# Patient Record
Sex: Female | Born: 2000
Health system: Southern US, Community
[De-identification: ages and names within clinical notes are randomized; demographics above are authoritative.]

## PROBLEM LIST (undated history)

## (undated) DIAGNOSIS — Z789 Other specified health status: Secondary | ICD-10-CM

---
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
## 2020-01-12 ENCOUNTER — Other Ambulatory Visit: Payer: Self-pay

## 2020-01-12 DIAGNOSIS — Z20822 Contact with and (suspected) exposure to covid-19: Secondary | ICD-10-CM

## 2020-01-14 LAB — NOVEL CORONAVIRUS, NAA: SARS-CoV-2, NAA: DETECTED — AB

## 2020-01-16 ENCOUNTER — Telehealth: Payer: Self-pay | Admitting: Nurse Practitioner

## 2020-01-16 NOTE — Telephone Encounter (Signed)
Called to discuss with Damya Pflum about Covid symptoms and the use of casirivimab/imdevimab, a combination monoclonal antibody infusion for those with mild to moderate Covid symptoms and at a high risk of hospitalization.    Pt does not qualify for infusion therapy as she has asymptomatic infection. Isolation precautions discussed. Advised to contact back for consideration should she develop symptoms. Patient verbalized understanding.    Willette Alma, AGPCNP-BC

## 2020-11-20 ENCOUNTER — Ambulatory Visit
Admission: RE | Admit: 2020-11-20 | Discharge: 2020-11-20 | Disposition: A | Discharge status: Home / Self Care | Payer: BC Managed Care – PPO | Source: Ambulatory Visit | Attending: Family Medicine | Admitting: Family Medicine

## 2020-11-20 ENCOUNTER — Other Ambulatory Visit: Payer: Self-pay

## 2020-11-20 ENCOUNTER — Ambulatory Visit: Payer: Self-pay

## 2020-11-20 VITALS — BP 111/75 | HR 79 | Temp 98.5°F | Ht 63.5 in | Wt 260.0 lb

## 2020-11-20 DIAGNOSIS — U071 COVID-19: Secondary | ICD-10-CM | POA: Diagnosis not present

## 2020-11-20 MED ORDER — PROMETHAZINE-DM 6.25-15 MG/5ML PO SYRP: 5.0000 mL | ORAL_SOLUTION | Freq: Four times a day (QID) | ORAL | 0 refills | Status: DC | PRN

## 2020-11-20 MED ORDER — CETIRIZINE HCL 10 MG PO TABS: 10.0000 mg | ORAL_TABLET | Freq: Every day | ORAL | 11 refills | Status: DC

## 2020-11-20 NOTE — ED Triage Notes (Signed)
Pt states that she has a headache, muscle aches, coughing, and sneezing. X2 days. Pt states that she tested positive 11/18/2020.

## 2020-11-20 NOTE — ED Provider Notes (Signed)
[facility]    CSN: [id number] Arrival date & time: 11/20/20  1548      History   Chief Complaint Chief Complaint  Patient presents with   Cough    Pt states that she has a cough, headache, sneezing and body aches. X2 days. Pt states that she tested positive 11/18/20    HPI Kendra Gibson is a 20 y.o. female.   HPI Patient presents with URI symptoms including cough, chills, body aches,  otalgia, nasal congestion, runny nose, and sinus pressure. Afebrile. Exposed to COVID exposure. Tested positive 11/18/20.Denies worrisome symptoms of shortness of breath, weakness, N&V, or chest pain.  History reviewed. No pertinent past medical history.  There are no problems to display for this patient.   Past Surgical History:  Procedure Laterality Date   EYE SURGERY      OB History   No obstetric history on file.      Home Medications    Prior to Admission medications   Medication Sig Start Date End Date Taking? Authorizing Provider  cetirizine (ZYRTEC) 10 MG tablet Take 1 tablet (10 mg total) by mouth at bedtime. 11/20/20  Yes Bing Neighbors, FNP  promethazine-dextromethorphan (PROMETHAZINE-DM) 6.25-15 MG/5ML syrup Take 5 mLs by mouth 4 (four) times daily as needed for cough. 11/20/20  Yes Bing Neighbors, FNP    Family History History reviewed. No pertinent family history.  Social History Social History   Tobacco Use   Smoking status: Never   Smokeless tobacco: Never     Allergies   Patient has no known allergies.   Review of Systems Review of Systems Pertinent negatives listed in HPI  Physical Exam Triage Vital Signs ED Triage Vitals  Enc Vitals Group     BP 11/20/20 1621 111/75     Pulse Rate 11/20/20 1621 79     Resp --      Temp 11/20/20 1621 98.5 F (36.9 C)     Temp Source 11/20/20 1621 Oral     SpO2 11/20/20 1621 96 %     Weight 11/20/20 1629 260 lb (117.9 kg)     Height 11/20/20 1629 5' 3.5" (1.613 m)     Head Circumference --       Peak Flow --      Pain Score 11/20/20 1629 9     Pain Loc --      Pain Edu? --      Excl. in GC? --    No data found.  Updated Vital Signs BP 111/75 (BP Location: Right Arm)   Pulse 79   Temp 98.5 F (36.9 C) (Oral)   Ht 5' 3.5" (1.613 m)   Wt 260 lb (117.9 kg)   LMP 10/27/2020   SpO2 96%   BMI 45.33 kg/m   Visual Acuity Right Eye Distance:   Left Eye Distance:   Bilateral Distance:    Right Eye Near:   Left Eye Near:    Bilateral Near:     Physical Exam  General Appearance:    Alert, cooperative, no distress  HENT:   Normocephalic, ears normal, nares mucosal edema with congestion, rhinorrhea, oropharynx patent   Eyes:    PERRL, conjunctiva/corneas clear, EOM's intact       Lungs:     Clear to auscultation bilaterally, respirations unlabored  Heart:    Regular rate and rhythm  Neurologic:   Awake, alert, oriented x 3. No apparent focal neurological  deficit       UC Treatments /  Results  Labs (all labs ordered are listed, but only abnormal results are displayed) Labs Reviewed - No data to display  EKG   Radiology No results found.  Procedures Procedures (including critical care time)  Medications Ordered in UC Medications - No data to display  Initial Impression / Assessment and Plan / UC Course  I have reviewed the triage vital signs and the nursing notes.  Pertinent labs & imaging results that were available during my care of the patient were reviewed by me and considered in my medical decision making (see chart for details).    +Covid. Symptom management warranted only.  Manage fever with Tylenol and ibuprofen.  Nasal symptoms with over-the-counter antihistamines recommended.  Treatment per discharge medications/discharge instructions.  Red flags/ER precautions given. Work note provided.  Final Clinical Impressions(s) / UC Diagnoses   Final diagnoses:  COVID-19 virus infection   Discharge Instructions   None    ED Prescriptions      Medication Sig Dispense Auth. Provider   promethazine-dextromethorphan (PROMETHAZINE-DM) 6.25-15 MG/5ML syrup Take 5 mLs by mouth 4 (four) times daily as needed for cough. 140 mL Bing Neighbors, FNP   cetirizine (ZYRTEC) 10 MG tablet Take 1 tablet (10 mg total) by mouth at bedtime. 30 tablet Bing Neighbors, FNP      PDMP not reviewed this encounter.   Bing Neighbors, FNP 11/20/20 1722

## 2021-01-11 ENCOUNTER — Ambulatory Visit: Payer: Self-pay

## 2021-04-24 ENCOUNTER — Emergency Department (HOSPITAL_BASED_OUTPATIENT_CLINIC_OR_DEPARTMENT_OTHER)
Admission: EM | Admit: 2021-04-24 | Discharge: 2021-04-24 | Disposition: A | Discharge status: Home / Self Care | Payer: BC Managed Care – PPO | Attending: Emergency Medicine | Admitting: Emergency Medicine

## 2021-04-24 ENCOUNTER — Encounter (HOSPITAL_BASED_OUTPATIENT_CLINIC_OR_DEPARTMENT_OTHER): Payer: Self-pay

## 2021-04-24 ENCOUNTER — Other Ambulatory Visit: Payer: Self-pay

## 2021-04-24 DIAGNOSIS — R519 Headache, unspecified: Secondary | ICD-10-CM | POA: Insufficient documentation

## 2021-04-24 DIAGNOSIS — Y92410 Unspecified street and highway as the place of occurrence of the external cause: Secondary | ICD-10-CM | POA: Diagnosis not present

## 2021-04-24 DIAGNOSIS — G8911 Acute pain due to trauma: Secondary | ICD-10-CM | POA: Insufficient documentation

## 2021-04-24 MED ORDER — IBUPROFEN 400 MG PO TABS
400.0000 mg | ORAL_TABLET | Freq: Once | ORAL | Status: AC
  Administered 2021-04-24: 19:00:00 400 mg via ORAL
  Filled 2021-04-24: qty 1

## 2021-04-24 NOTE — Discharge Instructions (Addendum)
Please refer to the attached instructions 
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
## 2023-08-31 NOTE — H&P (Signed)
 HISTORY AND PHYSICAL  Kendra Gibson is a 23 y.o. female patient with CC: Off and on pain in wisdom teeth for several months.   No diagnosis found.  No past medical history on file.  No current facility-administered medications for this encounter.   Current Outpatient Medications  Medication Sig Dispense Refill   cetirizine  (ZYRTEC ) 10 MG tablet Take 1 tablet (10 mg total) by mouth at bedtime. 30 tablet 11   promethazine -dextromethorphan (PROMETHAZINE -DM) 6.25-15 MG/5ML syrup Take 5 mLs by mouth 4 (four) times daily as needed for cough. 140 mL 0   No Known Allergies Active Problems:   * No active hospital problems. *  Vitals: There were no vitals taken for this visit. Lab results:No results found for this or any previous visit (from the past 24 hours). Radiology Results: No results found. General appearance: alert, cooperative, no distress, and morbidly obese Head: Normocephalic, without obvious abnormality, atraumatic Eyes: negative Nose: Nares normal. Septum midline. Mucosa normal. No drainage or sinus tenderness. Throat: lips, mucosa, and tongue normal; teeth and gums normal Neck: no adenopathy  Assessment: 40 y Female morbid obesity with impacted teeth 3 1, 16, 17, 32.   Plan: Extraction teeth 1, 16, 17, 32, GA. Day surgery.    Ascencion Lava 08/31/2023

## 2023-09-02 ENCOUNTER — Encounter (HOSPITAL_COMMUNITY): Payer: Self-pay | Admitting: Oral Surgery

## 2023-09-02 ENCOUNTER — Other Ambulatory Visit: Payer: Self-pay

## 2023-09-02 NOTE — Progress Notes (Signed)
 PCP - NP PCP Cardiologist -   PPM/ICD - denies Device Orders - n/a Rep Notified - n/a  Chest x-ray - denies EKG - denies Stress Test - denies ECHO - denies Cardiac Cath - denies  CPAP - n/a DM -denies  Blood Thinner Instructions: denies Aspirin Instructions: n/a  ERAS Protcol - NPO  COVID TEST- n/a  Anesthesia review: no  Patient verbally denies any shortness of breath, fever, cough and chest pain during phone call   -------------  SDW INSTRUCTIONS given:  Your procedure is scheduled on September 04, 2023.  Report to [facility] Main Entrance "A" at 6:40 A.M., and check in at the Admitting office.  Call this number if you have problems the morning of surgery:  [phone]   Remember:  Do not eat or drink after midnight the night before your surgery    Take these medicines the morning of surgery with A SIP OF WATER  acetaminophen  (TYLENOL )  VIENVA    As of today, STOP taking any Aspirin (unless otherwise instructed by your surgeon) Aleve, Naproxen, Ibuprofen , Motrin , Advil , Goody's, BC's, all herbal medications, fish oil, and all vitamins.                      Do not wear jewelry, make up, or nail polish            Do not wear lotions, powders, perfumes/colognes, or deodorant.            Do not shave 48 hours prior to surgery.  Men may shave face and neck.            Do not bring valuables to the hospital.            [facility] is not responsible for any belongings or valuables.  Do NOT Smoke (Tobacco/Vaping) 24 hours prior to your procedure If you use a CPAP at night, you may bring all equipment for your overnight stay.   Contacts, glasses, dentures or bridgework may not be worn into surgery.      For patients admitted to the hospital, discharge time will be determined by your treatment team.   Patients discharged the day of surgery will not be allowed to drive home, and someone needs to stay with them for 24 hours.    Special instructions:   [facility]-  Preparing For Surgery  Before surgery, you can play an important role. Because skin is not sterile, your skin needs to be as free of germs as possible. You can reduce the number of germs on your skin by washing with CHG (chlorahexidine gluconate) Soap before surgery.  CHG is an antiseptic cleaner which kills germs and bonds with the skin to continue killing germs even after washing.    Oral Hygiene is also important to reduce your risk of infection.  Remember - BRUSH YOUR TEETH THE MORNING OF SURGERY WITH YOUR REGULAR TOOTHPASTE  Please do not use if you have an allergy to CHG or antibacterial soaps. If your skin becomes reddened/irritated stop using the CHG.  Do not shave (including legs and underarms) for at least 48 hours prior to first CHG shower. It is OK to shave your face.  Please follow these instructions carefully.   Shower the NIGHT BEFORE SURGERY and the MORNING OF SURGERY with DIAL Soap.   Pat yourself dry with a CLEAN TOWEL.  Wear CLEAN PAJAMAS to bed the night before surgery  Place CLEAN SHEETS on your bed the night of  your first shower and DO NOT SLEEP WITH PETS.   Day of Surgery: Please shower morning of surgery  Wear Clean/Comfortable clothing the morning of surgery Do not apply any deodorants/lotions.   Remember to brush your teeth WITH YOUR REGULAR TOOTHPASTE.   Questions were answered. Patient verbalized understanding of instructions.

## 2023-09-04 ENCOUNTER — Encounter (HOSPITAL_COMMUNITY)
Admission: RE | Disposition: A | Discharge status: Home / Self Care | Payer: Self-pay | Source: Home / Self Care | Attending: Oral Surgery

## 2023-09-04 ENCOUNTER — Encounter (HOSPITAL_COMMUNITY): Payer: Self-pay | Admitting: Oral Surgery

## 2023-09-04 ENCOUNTER — Ambulatory Visit (HOSPITAL_COMMUNITY): Payer: Self-pay | Admitting: Anesthesiology

## 2023-09-04 ENCOUNTER — Ambulatory Visit (HOSPITAL_COMMUNITY)
Admission: RE | Admit: 2023-09-04 | Discharge: 2023-09-04 | Disposition: A | Discharge status: Home / Self Care | Attending: Oral Surgery | Admitting: Oral Surgery

## 2023-09-04 ENCOUNTER — Other Ambulatory Visit: Payer: Self-pay

## 2023-09-04 DIAGNOSIS — E66813 Obesity, class 3: Secondary | ICD-10-CM | POA: Diagnosis not present

## 2023-09-04 DIAGNOSIS — K011 Impacted teeth: Secondary | ICD-10-CM | POA: Insufficient documentation

## 2023-09-04 DIAGNOSIS — Z6841 Body Mass Index (BMI) 40.0 and over, adult: Secondary | ICD-10-CM | POA: Insufficient documentation

## 2023-09-04 HISTORY — PX: TOOTH EXTRACTION: SHX859

## 2023-09-04 HISTORY — DX: Other specified health status: Z78.9

## 2023-09-04 LAB — CBC
HCT: 35.7 % — ABNORMAL LOW (ref 36.0–46.0)
Hemoglobin: 11.9 g/dL — ABNORMAL LOW (ref 12.0–15.0)
MCH: 30.8 pg (ref 26.0–34.0)
MCHC: 33.3 g/dL (ref 30.0–36.0)
MCV: 92.5 fL (ref 80.0–100.0)
Platelets: 333 10*3/uL (ref 150–400)
RBC: 3.86 MIL/uL — ABNORMAL LOW (ref 3.87–5.11)
RDW: 11.8 % (ref 11.5–15.5)
WBC: 7.6 10*3/uL (ref 4.0–10.5)
nRBC: 0 % (ref 0.0–0.2)

## 2023-09-04 LAB — POCT PREGNANCY, URINE: Preg Test, Ur: NEGATIVE

## 2023-09-04 SURGERY — DENTAL RESTORATION/EXTRACTIONS
Anesthesia: General | Site: Mouth

## 2023-09-04 MED ORDER — ROCURONIUM BROMIDE 10 MG/ML (PF) SYRINGE
PREFILLED_SYRINGE | INTRAVENOUS | Status: AC
  Filled 2023-09-04: qty 10

## 2023-09-04 MED ORDER — PROPOFOL 10 MG/ML IV BOLUS
INTRAVENOUS | Status: AC
  Filled 2023-09-04: qty 20

## 2023-09-04 MED ORDER — FENTANYL CITRATE (PF) 250 MCG/5ML IJ SOLN
INTRAMUSCULAR | Status: AC
  Filled 2023-09-04: qty 5

## 2023-09-04 MED ORDER — CHLORHEXIDINE GLUCONATE 0.12 % MT SOLN
15.0000 mL | Freq: Once | OROMUCOSAL | Status: AC
  Administered 2023-09-04: 15 mL via OROMUCOSAL
  Filled 2023-09-04: qty 15

## 2023-09-04 MED ORDER — OXYMETAZOLINE HCL 0.05 % NA SOLN
NASAL | Status: AC
  Filled 2023-09-04: qty 30

## 2023-09-04 MED ORDER — AMISULPRIDE (ANTIEMETIC) 5 MG/2ML IV SOLN: 10.0000 mg | Freq: Once | INTRAVENOUS | Status: DC | PRN

## 2023-09-04 MED ORDER — LIDOCAINE-EPINEPHRINE 2 %-1:100000 IJ SOLN
INTRAMUSCULAR | Status: DC | PRN
  Administered 2023-09-04: 20 mL

## 2023-09-04 MED ORDER — ONDANSETRON HCL 4 MG/2ML IJ SOLN
INTRAMUSCULAR | Status: DC | PRN
  Administered 2023-09-04: 4 mg via INTRAVENOUS

## 2023-09-04 MED ORDER — SODIUM CHLORIDE 0.9 % IR SOLN
Status: DC | PRN
  Administered 2023-09-04: 1000 mL

## 2023-09-04 MED ORDER — PHENYLEPHRINE 80 MCG/ML (10ML) SYRINGE FOR IV PUSH (FOR BLOOD PRESSURE SUPPORT)
PREFILLED_SYRINGE | INTRAVENOUS | Status: AC
  Filled 2023-09-04: qty 10

## 2023-09-04 MED ORDER — LIDOCAINE 2% (20 MG/ML) 5 ML SYRINGE
INTRAMUSCULAR | Status: DC | PRN
Start: 2023-09-04 — End: 2023-09-04
  Administered 2023-09-04: 60 mg via INTRAVENOUS

## 2023-09-04 MED ORDER — CEFAZOLIN SODIUM-DEXTROSE 3-4 GM/150ML-% IV SOLN
3.0000 g | INTRAVENOUS | Status: AC
  Administered 2023-09-04: 3 g via INTRAVENOUS
  Filled 2023-09-04: qty 150

## 2023-09-04 MED ORDER — DEXAMETHASONE SODIUM PHOSPHATE 10 MG/ML IJ SOLN
INTRAMUSCULAR | Status: AC
  Filled 2023-09-04: qty 1

## 2023-09-04 MED ORDER — MIDAZOLAM HCL 2 MG/2ML IJ SOLN
INTRAMUSCULAR | Status: DC | PRN
Start: 2023-09-04 — End: 2023-09-04
  Administered 2023-09-04: 2 mg via INTRAVENOUS

## 2023-09-04 MED ORDER — OXYCODONE HCL 5 MG/5ML PO SOLN: 5.0000 mg | Freq: Once | ORAL | Status: DC | PRN

## 2023-09-04 MED ORDER — FENTANYL CITRATE (PF) 250 MCG/5ML IJ SOLN
INTRAMUSCULAR | Status: DC | PRN
  Administered 2023-09-04 (×2): 50 ug via INTRAVENOUS

## 2023-09-04 MED ORDER — PROPOFOL 10 MG/ML IV BOLUS
INTRAVENOUS | Status: DC | PRN
Start: 2023-09-04 — End: 2023-09-04
  Administered 2023-09-04: 180 mg via INTRAVENOUS
  Administered 2023-09-04: 50 mg via INTRAVENOUS
  Administered 2023-09-04: 20 mg via INTRAVENOUS

## 2023-09-04 MED ORDER — MIDAZOLAM HCL 2 MG/2ML IJ SOLN
INTRAMUSCULAR | Status: AC
  Filled 2023-09-04: qty 2

## 2023-09-04 MED ORDER — OXYCODONE HCL 5 MG PO TABS: 5.0000 mg | ORAL_TABLET | Freq: Once | ORAL | Status: DC | PRN

## 2023-09-04 MED ORDER — LACTATED RINGERS IV SOLN
INTRAVENOUS | Status: DC
Start: 2023-09-04 — End: 2023-09-04

## 2023-09-04 MED ORDER — DEXMEDETOMIDINE HCL IN NACL 80 MCG/20ML IV SOLN
INTRAVENOUS | Status: AC
  Filled 2023-09-04: qty 20

## 2023-09-04 MED ORDER — ORAL CARE MOUTH RINSE: 15.0000 mL | Freq: Once | OROMUCOSAL | Status: AC

## 2023-09-04 MED ORDER — DEXMEDETOMIDINE HCL IN NACL 80 MCG/20ML IV SOLN
INTRAVENOUS | Status: DC | PRN
  Administered 2023-09-04: 12 ug via INTRAVENOUS
  Administered 2023-09-04: 8 ug via INTRAVENOUS

## 2023-09-04 MED ORDER — 0.9 % SODIUM CHLORIDE (POUR BTL) OPTIME
TOPICAL | Status: DC | PRN
  Administered 2023-09-04: 1000 mL

## 2023-09-04 MED ORDER — FENTANYL CITRATE (PF) 100 MCG/2ML IJ SOLN: 25.0000 ug | INTRAMUSCULAR | Status: DC | PRN

## 2023-09-04 MED ORDER — DEXAMETHASONE SODIUM PHOSPHATE 10 MG/ML IJ SOLN
INTRAMUSCULAR | Status: DC | PRN
  Administered 2023-09-04: 10 mg via INTRAVENOUS

## 2023-09-04 MED ORDER — OXYMETAZOLINE HCL 0.05 % NA SOLN
NASAL | Status: DC | PRN
  Administered 2023-09-04 (×2): 2 via NASAL

## 2023-09-04 MED ORDER — ONDANSETRON HCL 4 MG/2ML IJ SOLN
INTRAMUSCULAR | Status: AC
  Filled 2023-09-04: qty 2

## 2023-09-04 MED ORDER — EPHEDRINE 5 MG/ML INJ
INTRAVENOUS | Status: AC
  Filled 2023-09-04: qty 5

## 2023-09-04 MED ORDER — ROCURONIUM BROMIDE 10 MG/ML (PF) SYRINGE
PREFILLED_SYRINGE | INTRAVENOUS | Status: DC | PRN
  Administered 2023-09-04: 50 mg via INTRAVENOUS

## 2023-09-04 MED ORDER — LIDOCAINE 2% (20 MG/ML) 5 ML SYRINGE
INTRAMUSCULAR | Status: AC
  Filled 2023-09-04: qty 5

## 2023-09-04 MED ORDER — AMOXICILLIN 500 MG PO CAPS: 500.0000 mg | ORAL_CAPSULE | Freq: Three times a day (TID) | ORAL | 0 refills | Status: AC

## 2023-09-04 MED ORDER — PHENYLEPHRINE 80 MCG/ML (10ML) SYRINGE FOR IV PUSH (FOR BLOOD PRESSURE SUPPORT): PREFILLED_SYRINGE | INTRAVENOUS | Status: DC | PRN

## 2023-09-04 MED ORDER — ACETAMINOPHEN 500 MG PO TABS
1000.0000 mg | ORAL_TABLET | Freq: Once | ORAL | Status: AC
  Administered 2023-09-04: 1000 mg via ORAL
  Filled 2023-09-04: qty 2

## 2023-09-04 MED ORDER — HYDROCODONE-ACETAMINOPHEN 5-325 MG PO TABS: 1.0000 | ORAL_TABLET | ORAL | 0 refills | Status: AC | PRN

## 2023-09-04 MED ORDER — SUGAMMADEX SODIUM 200 MG/2ML IV SOLN
INTRAVENOUS | Status: DC | PRN
Start: 2023-09-04 — End: 2023-09-04
  Administered 2023-09-04: 300 mg via INTRAVENOUS

## 2023-09-04 MED ORDER — LIDOCAINE-EPINEPHRINE 2 %-1:100000 IJ SOLN
INTRAMUSCULAR | Status: AC
  Filled 2023-09-04: qty 1

## 2023-09-04 SURGICAL SUPPLY — 26 items
BAG COUNTER SPONGE SURGICOUNT (BAG) IMPLANT
BLADE SURG 15 STRL LF DISP TIS (BLADE) ×1 IMPLANT
BUR CROSS CUT FISSURE 1.6 (BURR) ×1 IMPLANT
BUR EGG ELITE 4.0 (BURR) IMPLANT
CANISTER SUCT 3000ML PPV (MISCELLANEOUS) ×1 IMPLANT
COVER SURGICAL LIGHT HANDLE (MISCELLANEOUS) ×1 IMPLANT
GAUZE PACKING FOLDED 2 STR (GAUZE/BANDAGES/DRESSINGS) ×1 IMPLANT
GLOVE BIO SURGEON STRL SZ8 (GLOVE) ×1 IMPLANT
GOWN STRL REUS W/ TWL LRG LVL3 (GOWN DISPOSABLE) ×1 IMPLANT
GOWN STRL REUS W/ TWL XL LVL3 (GOWN DISPOSABLE) ×1 IMPLANT
IV NS 1000ML BAXH (IV SOLUTION) ×1 IMPLANT
KIT BASIN OR (CUSTOM PROCEDURE TRAY) ×1 IMPLANT
KIT TURNOVER KIT B (KITS) ×1 IMPLANT
NDL HYPO 25GX1X1/2 BEV (NEEDLE) ×2 IMPLANT
NEEDLE HYPO 25GX1X1/2 BEV (NEEDLE) ×1 IMPLANT
NS IRRIG 1000ML POUR BTL (IV SOLUTION) ×1 IMPLANT
PAD ARMBOARD POSITIONER FOAM (MISCELLANEOUS) ×1 IMPLANT
SLEEVE IRRIGATION ELITE 7 (MISCELLANEOUS) ×1 IMPLANT
SPIKE FLUID TRANSFER (MISCELLANEOUS) IMPLANT
SUT CHROMIC 3 0 SH 27 (SUTURE) IMPLANT
SUT PLAIN 3 0 PS2 27 (SUTURE) IMPLANT
SYR BULB IRRIG 60ML STRL (SYRINGE) ×1 IMPLANT
SYR CONTROL 10ML LL (SYRINGE) ×1 IMPLANT
TRAY ENT MC OR (CUSTOM PROCEDURE TRAY) ×1 IMPLANT
TUBING IRRIGATION (MISCELLANEOUS) ×1 IMPLANT
YANKAUER SUCT BULB TIP NO VENT (SUCTIONS) ×1 IMPLANT

## 2023-09-04 NOTE — Anesthesia Procedure Notes (Addendum)
 Procedure Name: Intubation Date/Time: 09/04/2023 9:40 AM  Performed by: Dawna Etienne, CRNAPre-anesthesia Checklist: Patient identified, Emergency Drugs available, Suction available and Patient being monitored Patient Re-evaluated:Patient Re-evaluated prior to induction Oxygen Delivery Method: Circle system utilized Preoxygenation: Pre-oxygenation with 100% oxygen Induction Type: IV induction Ventilation: Two handed mask ventilation required Laryngoscope Size: Mac, 3 and Glidescope Nasal Tubes: Nasal prep performed, Nasal Rae and Right Tube size: 7.0 mm Number of attempts: 1 Airway Equipment and Method: Video-laryngoscopy Placement Confirmation: ETT inserted through vocal cords under direct vision, positive ETCO2 and breath sounds checked- equal and bilateral Tube secured with: Tape Dental Injury: Teeth and Oropharynx as per pre-operative assessment  Difficulty Due To: Difficult Airway- due to large tongue

## 2023-09-04 NOTE — Op Note (Signed)
 Kendra Gibson, HAR[place] MEDICAL RECORD NO: [id number] ACCOUNT NO: [id number] DATE OF BIRTH: Jun 07, 2000 FACILITY: [facility] LOCATION: [facility] PHYSICIAN: Cornelia Dieter, DDS  Operative Report   DATE OF PROCEDURE: 09/04/2023  PREOPERATIVE DIAGNOSES:  Impacted teeth 1, 16, 17, and 32. Morbid obesity.  POSTOPERATIVE DIAGNOSES:  Impacted teeth 1, 16, 17, and 32. Morbid obesity.  PROCEDURE:  Extraction of teeth 1, 16, 17, and 32.  SURGEON:  Cornelia Dieter, DDS  ANESTHESIA:  General, nasal intubation.  Attending was [employer]  DESCRIPTION OF PROCEDURE:  The patient was taken to the operating room, intubated, and the tube was secured.  The eyes were protected and the patient was draped for surgery.  A timeout was performed.  The posterior pharynx was suctioned.  A throat pack  was placed.  2% lidocaine  with 1:100,000 epinephrine  was infiltrated.  An inferior alveolar block on the right and left side and then buccal and palatal infiltration of the maxilla around the teeth to be removed.  A bite block was placed on the right  side of the mouth and a Sweetheart retractor was used to retract the tongue.  A 15 blade was used to make an incision overlying tooth #17 and carried forward in the buccal gingival sulcus and the embrasure between teeth numbers 18 and 19.  The periosteal  elevator was used to reflect the flap and then bone was removed using a Stryker handpiece with fissure bur under irrigation until the tooth was identified.  Then, the tooth was sectioned and removed in pieces.  The socket was curetted, irrigated, and  closed with 3-0 chromic.  Then, the 15 blade was used to make a hockey stick incision overlying tooth #16 carried forward in the buccal gingival sulcus to reflect the papilla between tooth numbers 14 and 15.  Then, the periosteal elevator was used to  reflect the flap.  Bone was removed with the Stryker handpiece and the tooth was identified.  The tooth was removed using the POTTS  elevator and the 301 elevator.  The socket was curetted, irrigated, and closed with 3-0 chromic.  Attention was turned to  the right side.  A 15 blade was used to make an envelope incision overlying tooth #32 and carried forward in the buccal sulcus to tooth #31.  The periosteum was reflected.  The tooth was exposed using the Stryker handpiece to remove overlying bone and  then the tooth was sectioned and removed with the 301 elevator.  The socket was curetted, irrigated, and closed with 3-0 chromic.  Then, tooth #1 was operated using a 15 blade to make an incision overlying tooth #1 and carried forward to release the  papilla between teeth numbers 2 and 3.  The periosteum was reflected.  The bone was removed using a Stryker handpiece and the tooth was elevated and removed with a 301 elevator.  The socket was curetted, irrigated, and closed with 3-0 chromic.   Additional local anesthesia was administered.  The oral cavity was irrigated and suctioned.  The throat pack was removed.  The patient was left in care of anesthesia for extubation and transport to recovery with plans for discharge home through Day  Surgery.  ESTIMATED BLOOD LOSS:  Minimal.  COMPLICATIONS:  None.  SPECIMENS:  None.   PUS D: 09/04/2023 10:22:25 am T: 09/04/2023 1:37:00 pm  JOB: [id number]/ [id number]

## 2023-09-04 NOTE — Anesthesia Preprocedure Evaluation (Addendum)
 Anesthesia Evaluation  Patient identified by MRN, date of birth, ID band Patient awake    Reviewed: Allergy & Precautions, NPO status , Patient's Chart, lab work & pertinent test results  Airway Mallampati: II  TM Distance: >3 FB Neck ROM: Full    Dental no notable dental hx. (+) Teeth Intact, Dental Advisory Given   Pulmonary neg pulmonary ROS   Pulmonary exam normal breath sounds clear to auscultation       Cardiovascular negative cardio ROS Normal cardiovascular exam Rhythm:Regular Rate:Normal     Neuro/Psych negative neurological ROS  negative psych ROS   GI/Hepatic negative GI ROS, Neg liver ROS,,,  Endo/Other    Class 3 obesity (BMI 49)  Renal/GU negative Renal ROS  negative genitourinary   Musculoskeletal negative musculoskeletal ROS (+)    Abdominal   Peds  Hematology negative hematology ROS (+)   Anesthesia Other Findings   Reproductive/Obstetrics                             Anesthesia Physical Anesthesia Plan  ASA: 3  Anesthesia Plan: General   Post-op Pain Management: Tylenol  PO (pre-op)*   Induction: Intravenous  PONV Risk Score and Plan: 3 and Midazolam , Dexamethasone  and Ondansetron   Airway Management Planned: Nasal ETT  Additional Equipment:   Intra-op Plan:   Post-operative Plan: Extubation in OR  Informed Consent: I have reviewed the patients History and Physical, chart, labs and discussed the procedure including the risks, benefits and alternatives for the proposed anesthesia with the patient or authorized representative who has indicated his/her understanding and acceptance.     Dental advisory given  Plan Discussed with: CRNA  Anesthesia Plan Comments:        Anesthesia Quick Evaluation

## 2023-09-04 NOTE — Op Note (Signed)
 09/04/2023  10:18 AM  PATIENT:  Kendra Gibson  23 y.o. female  PRE-OPERATIVE DIAGNOSIS:  Impacted teeth # 1, 16, 17 32  POST-OPERATIVE DIAGNOSIS:  SAME  PROCEDURE:  Procedure(s): EXTRACTION TEETH NUMBER ONE, SIXTEEN, SEVENTEEN, THIRTY TWO  SURGEON:  Surgeon(s): Ascencion Lava, DMD  ANESTHESIA:   local and general  EBL:  minimal  DRAINS: none   SPECIMEN:  No Specimen  COUNTS:  YES  PLAN OF CARE: Discharge to home after PACU  PATIENT DISPOSITION:  PACU - hemodynamically stable.   PROCEDURE DETAILS: Dictation # [id number]  Kendra Gibson, DMD 09/04/2023 10:18 AM

## 2023-09-04 NOTE — H&P (Signed)
 H&P documentation  -History and Physical Reviewed  -Patient has been re-examined  -No change in the plan of care  Kendra Gibson

## 2023-09-04 NOTE — Transfer of Care (Signed)
 Immediate Anesthesia Transfer of Care Note  Patient: Kendra Gibson  Procedure(s) Performed: EXTRACTION TEETH NUMBER ONE, SIXTEEN, SEVENTEEN, THIRTY TWO (Mouth)  Patient Location: PACU  Anesthesia Type:General  Level of Consciousness: drowsy, patient cooperative, and responds to stimulation  Airway & Oxygen Therapy: Patient Spontanous Breathing and Patient connected to nasal cannula oxygen  Post-op Assessment: Report given to RN, Post -op Vital signs reviewed and stable, and Patient moving all extremities X 4  Post vital signs: Reviewed and stable  Last Vitals:  Vitals Value Taken Time  BP 93/70 09/04/23 1033  Temp 37 C 09/04/23 1033  Pulse 85 09/04/23 1040  Resp 19 09/04/23 1040  SpO2 96 % 09/04/23 1040  Vitals shown include unfiled device data.  Last Pain:  Vitals:   09/04/23 1033  TempSrc:   PainSc: Asleep      Patients Stated Pain Goal: 0 (09/04/23 0734)  Complications: No notable events documented.

## 2023-09-06 ENCOUNTER — Telehealth: Admitting: Physician Assistant

## 2023-09-06 ENCOUNTER — Encounter: Payer: Self-pay | Admitting: Physician Assistant

## 2023-09-06 DIAGNOSIS — K0889 Other specified disorders of teeth and supporting structures: Secondary | ICD-10-CM

## 2023-09-06 NOTE — Progress Notes (Signed)
 We are not able to create a note for you for that length of time. You will need to follow up with your oral surgeon to have that completed. I encourage you to either send them a message through myChart or cal their office in the morning. You will not be charged for this evisit.   I have spent 5 minutes in review of e-visit questionnaire, review and updating patient chart, medical decision making and response to patient.   Etter Hermann Mayers, PA-C

## 2023-09-07 ENCOUNTER — Encounter (HOSPITAL_COMMUNITY): Payer: Self-pay | Admitting: Oral Surgery

## 2023-09-13 NOTE — Anesthesia Postprocedure Evaluation (Signed)
 Anesthesia Post Note  Patient: Kendra Gibson  Procedure(s) Performed: EXTRACTION TEETH NUMBER ONE, SIXTEEN, SEVENTEEN, THIRTY TWO (Mouth)     Patient location during evaluation: PACU Anesthesia Type: General Level of consciousness: awake and alert Pain management: pain level controlled Vital Signs Assessment: post-procedure vital signs reviewed and stable Respiratory status: spontaneous breathing, nonlabored ventilation, respiratory function stable and patient connected to nasal cannula oxygen Cardiovascular status: blood pressure returned to baseline and stable Postop Assessment: no apparent nausea or vomiting Anesthetic complications: no   No notable events documented.  Last Vitals:  Vitals:   09/04/23 1103 09/04/23 1118  BP: (!) 96/53 103/60  Pulse: 78 80  Resp: 17 18  Temp:  37 C  SpO2: 92% 95%    Last Pain:  Vitals:   09/04/23 1118  TempSrc:   PainSc: 0-No pain                 Tamu Golz L Gwyndolyn Guilford

## 2024-02-02 ENCOUNTER — Ambulatory Visit
Admission: RE | Admit: 2024-02-02 | Discharge: 2024-02-02 | Disposition: A | Discharge status: Home / Self Care | Source: Ambulatory Visit

## 2024-02-02 ENCOUNTER — Ambulatory Visit (INDEPENDENT_AMBULATORY_CARE_PROVIDER_SITE_OTHER): Admitting: Radiology

## 2024-02-02 VITALS — BP 126/88 | HR 73 | Temp 98.3°F | Resp 16

## 2024-02-02 DIAGNOSIS — S8001XA Contusion of right knee, initial encounter: Secondary | ICD-10-CM | POA: Diagnosis not present

## 2024-02-02 MED ORDER — DICLOFENAC SODIUM 50 MG PO TBEC: 50.0000 mg | DELAYED_RELEASE_TABLET | Freq: Two times a day (BID) | ORAL | 1 refills | Status: AC

## 2024-02-02 NOTE — ED Provider Notes (Signed)
 [facility]  Note:  This document was prepared using Dragon voice recognition software and may include unintentional dictation errors.  MRN: [id number] DOB: June 11, 2000  Subjective:   Kendra Gibson is a 23 y.o. female presenting for right knee pain x 1 month.  Patient reports that she was working out when she accidentally hit her right knee with a kettle bell.  Patient reports that pain was 9 out of 10 at that time mild pain is down to 3 out of 10, but continues.  Patient has been taking ibuprofen  with mild improvement.  Patient denies any past history of any trauma or injury.  Patient would like evaluation to see if there is any way internal injury to the knee.  No current facility-administered medications for this encounter.  Current Outpatient Medications:    diclofenac  (VOLTAREN ) 50 MG EC tablet, Take 1 tablet (50 mg total) by mouth 2 (two) times daily., Disp: 30 tablet, Rfl: 1   acetaminophen  (TYLENOL ) 500 MG tablet, Take 1,000 mg by mouth every 6 (six) hours as needed for moderate pain (pain score 4-6)., Disp: , Rfl:    amoxicillin  (AMOXIL ) 500 MG capsule, Take 1 capsule (500 mg total) by mouth 3 (three) times daily., Disp: 21 capsule, Rfl: 0   ibuprofen  (ADVIL ) 800 MG tablet, Take 800 mg by mouth every 8 (eight) hours as needed for moderate pain (pain score 4-6)., Disp: , Rfl:    loperamide (IMODIUM) 2 MG capsule, Take 2-4 mg by mouth as needed for diarrhea or loose stools., Disp: , Rfl:    VIENVA 0.1-20 MG-MCG tablet, Take 1 tablet by mouth daily., Disp: , Rfl:    Allergies  Allergen Reactions   Banana     Itchy throat    Coconut (Cocos Nucifera)     Itchy throat    Latex Hives   Tomato     Itchy throat     Past Medical History:  Diagnosis Date   Medical history non-contributory      Past Surgical History:  Procedure Laterality Date   EYE SURGERY     TOOTH EXTRACTION N/A 09/04/2023   Procedure: EXTRACTION TEETH NUMBER ONE, SIXTEEN, SEVENTEEN,  THIRTY TWO;  Surgeon: Sheryle Hamilton, DMD;  Location: [facility] OR;  Service: Oral Surgery;  Laterality: N/A;    History reviewed. No pertinent family history.  Social History   Tobacco Use   Smoking status: Never   Smokeless tobacco: Never  Vaping Use   Vaping status: Never Used  Substance Use Topics   Alcohol use: Never   Drug use: Never    ROS Refer to HPI for ROS details.  Objective:   Vitals: BP 126/88 (BP Location: Right Arm)   Pulse 73   Temp 98.3 F (36.8 C) (Oral)   Resp 16   LMP 01/26/2024 (Approximate)   SpO2 96%   Physical Exam Vitals and nursing note reviewed.  Constitutional:      General: She is not in acute distress.    Appearance: Normal appearance. She is well-developed. She is not ill-appearing or toxic-appearing.  HENT:     Head: Normocephalic and atraumatic.  Cardiovascular:     Rate and Rhythm: Normal rate.  Pulmonary:     Effort: Pulmonary effort is normal. No respiratory distress.  Musculoskeletal:        General: Normal range of motion.     Right knee: Bony tenderness present. No swelling or erythema. Normal range of motion. Tenderness present. Normal pulse.  Skin:    General:  Skin is warm and dry.  Neurological:     General: No focal deficit present.     Mental Status: She is alert and oriented to person, place, and time.  Psychiatric:        Mood and Affect: Mood normal.        Behavior: Behavior normal.     Procedures  No results found for this or any previous visit (from the past 24 hours).  No results found.   Assessment and Plan :     Discharge Instructions       1. Contusion of right knee, initial encounter (Primary) - DG Knee Complete 4 Views Right x-ray performed in UC shows no acute dislocation. - diclofenac  (VOLTAREN ) 50 MG EC tablet; Take 1 tablet (50 mg total) by mouth 2 (two) times daily.  Dispense: 30 tablet; Refill: 1 - AMB referral to orthopedics for follow-up evaluation and ongoing management right knee  pain      Ethel KATHEE Aurea Aurea, [place] B, NP 02/02/24 1306

## 2024-02-02 NOTE — Discharge Instructions (Signed)
  1. Contusion of right knee, initial encounter (Primary) - DG Knee Complete 4 Views Right x-ray performed in UC shows no acute dislocation. - diclofenac  (VOLTAREN ) 50 MG EC tablet; Take 1 tablet (50 mg total) by mouth 2 (two) times daily.  Dispense: 30 tablet; Refill: 1 - AMB referral to orthopedics for follow-up evaluation and ongoing management right knee pain

## 2024-02-02 NOTE — ED Triage Notes (Signed)
 Pt c/o right knee pain. States she hit knee around August 22nd with kettlebell and pain was 9/10 but now pain is 5/10.

## 2024-03-02 ENCOUNTER — Telehealth: Admitting: Physician Assistant

## 2024-03-02 DIAGNOSIS — G8929 Other chronic pain: Secondary | ICD-10-CM

## 2024-03-02 DIAGNOSIS — M549 Dorsalgia, unspecified: Secondary | ICD-10-CM

## 2024-03-02 NOTE — Progress Notes (Signed)
 Because of ongoing symptoms > 4 weeks from onset and need for possible ongoing management and evaluation, I feel your condition warrants further evaluation and I recommend that you be seen for a face to face visit.  Please contact your primary care physician practice to be seen. Many offices offer virtual options to be seen via video if you are not comfortable going in person to a medical facility at this time.  NOTE: You will NOT be charged for this eVisit.  If you do not have a PCP, [facility] offers a free physician referral service available at [phone]. Our trained staff has the experience, knowledge and resources to put you in touch with a physician who is right for you.    If you are having a true medical emergency please call 911.   Your e-visit answers were reviewed by a board certified advanced clinical practitioner to complete your personal care plan.  Thank you for using e-Visits.
